# Patient Record
Sex: Male | Born: 1995 | Race: White | Hispanic: No | Marital: Single | Smoking: Never smoker
Health system: Southern US, Community
[De-identification: ages and names within clinical notes are randomized; demographics above are authoritative.]

---

## 2016-04-16 ENCOUNTER — Encounter (HOSPITAL_COMMUNITY): Payer: Self-pay

## 2016-04-16 ENCOUNTER — Emergency Department (HOSPITAL_COMMUNITY): Payer: BLUE CROSS/BLUE SHIELD

## 2016-04-16 ENCOUNTER — Emergency Department (HOSPITAL_COMMUNITY)
Admission: EM | Admit: 2016-04-16 | Discharge: 2016-04-16 | Disposition: A | Discharge status: Home / Self Care | Payer: BLUE CROSS/BLUE SHIELD | Attending: Emergency Medicine | Admitting: Emergency Medicine

## 2016-04-16 DIAGNOSIS — Y9366 Activity, soccer: Secondary | ICD-10-CM | POA: Insufficient documentation

## 2016-04-16 DIAGNOSIS — W1839XA Other fall on same level, initial encounter: Secondary | ICD-10-CM | POA: Insufficient documentation

## 2016-04-16 DIAGNOSIS — Y929 Unspecified place or not applicable: Secondary | ICD-10-CM | POA: Insufficient documentation

## 2016-04-16 DIAGNOSIS — S43015A Anterior dislocation of left humerus, initial encounter: Secondary | ICD-10-CM | POA: Diagnosis not present

## 2016-04-16 DIAGNOSIS — Y999 Unspecified external cause status: Secondary | ICD-10-CM | POA: Insufficient documentation

## 2016-04-16 DIAGNOSIS — S4992XA Unspecified injury of left shoulder and upper arm, initial encounter: Secondary | ICD-10-CM | POA: Diagnosis present

## 2016-04-16 MED ORDER — ETOMIDATE 2 MG/ML IV SOLN
INTRAVENOUS | Status: AC
  Filled 2016-04-16: qty 10

## 2016-04-16 MED ORDER — ETOMIDATE 2 MG/ML IV SOLN
INTRAVENOUS | Status: AC | PRN
  Administered 2016-04-16: 5 mg via INTRAVENOUS

## 2016-04-16 MED ORDER — FENTANYL CITRATE (PF) 100 MCG/2ML IJ SOLN
50.0000 ug | Freq: Once | INTRAMUSCULAR | Status: DC
  Filled 2016-04-16: qty 2

## 2016-04-16 MED ORDER — IBUPROFEN 400 MG PO TABS
600.0000 mg | ORAL_TABLET | Freq: Once | ORAL | Status: AC
  Administered 2016-04-16: 600 mg via ORAL
  Filled 2016-04-16: qty 3

## 2016-04-16 MED ORDER — MIDAZOLAM HCL 2 MG/2ML IJ SOLN
INTRAMUSCULAR | Status: AC
  Filled 2016-04-16: qty 4

## 2016-04-16 MED ORDER — MIDAZOLAM HCL 2 MG/2ML IJ SOLN
INTRAMUSCULAR | Status: AC | PRN
  Administered 2016-04-16: 1 mg via INTRAVENOUS

## 2016-04-16 MED ORDER — FENTANYL CITRATE (PF) 100 MCG/2ML IJ SOLN
INTRAMUSCULAR | Status: AC | PRN
  Administered 2016-04-16: 50 ug via INTRAVENOUS

## 2016-04-16 MED ORDER — ETOMIDATE 2 MG/ML IV SOLN
INTRAVENOUS | Status: AC | PRN
  Administered 2016-04-16: 10 mg via INTRAVENOUS

## 2016-04-16 NOTE — Discharge Instructions (Signed)
Take Ibuprofen 600mg  three times a day for the next 5 days. Use ice as needed

## 2016-04-16 NOTE — ED Provider Notes (Signed)
[facility]-EMERGENCY DEPT Provider Note   CSN: [id number] Arrival date & time: 04/16/16  1944     History   Chief Complaint No chief complaint on file.   HPI Jonathan Villa is a 20 y.o. male who presents with L shoulder pain. No significant PMH. He was playing soccer earlier today and slid and fell on to his left shoulder. He is right hand dominant. He reports deformity, swelling, and numbness of the forearm. Has never dislocated his shoulder before.   HPI  No past medical history on file.  There are no active problems to display for this patient.   No past surgical history on file.     Home Medications    Prior to Admission medications   Not on File    Family History No family history on file.  Social History Social History  Substance Use Topics  . Smoking status: Not on file  . Smokeless tobacco: Not on file  . Alcohol use Not on file     Allergies   Review of patient's allergies indicates not on file.   Review of Systems Review of Systems  Musculoskeletal: Positive for arthralgias. Negative for gait problem and joint swelling.  Neurological: Positive for numbness.  All other systems reviewed and are negative.    Physical Exam Updated Vital Signs BP 113/63   Pulse 78   Resp (!) 32   SpO2 98%   Physical Exam  Constitutional: He is oriented to person, place, and time. He appears well-developed and well-nourished. No distress.  HENT:  Head: Normocephalic and atraumatic.  Eyes: Conjunctivae are normal. Pupils are equal, round, and reactive to light. Right eye exhibits no discharge. Left eye exhibits no discharge. No scleral icterus.  Neck: Normal range of motion. Neck supple.  Cardiovascular: Normal rate and regular rhythm.   No murmur heard. Pulmonary/Chest: Effort normal and breath sounds normal. No respiratory distress.  Abdominal: Soft. He exhibits no distension. There is no tenderness.  Musculoskeletal: He exhibits no edema.  Left shoulder: Obvious  step off deformity. No swelling. Significant tenderness to palpation. ROM deferred. N/V intact.   Neurological: He is alert and oriented to person, place, and time.  Skin: Skin is warm and dry.  Psychiatric: He has a normal mood and affect.  Nursing note and vitals reviewed.    ED Treatments / Results  Labs (all labs ordered are listed, but only abnormal results are displayed) Labs Reviewed - No data to display  EKG  EKG Interpretation None       Radiology Dg Shoulder Left Portable  Result Date: 04/16/2016 CLINICAL DATA:  Post reduction of LEFT shoulder EXAM: LEFT SHOULDER - 1 VIEW COMPARISON:  04/16/2016 FINDINGS: Previously identified LEFT glenohumeral dislocation appears reduced. Osseous mineralization normal. No definite fracture seen. AC joint alignment normal. IMPRESSION: Reduction of previously identified anterior LEFT glenohumeral dislocation. Electronically Signed   By: Ulyses Southward M.D.   On: 04/16/2016 21:00   Dg Shoulder Left Portable  Result Date: 04/16/2016 CLINICAL DATA:  20 year old male with left shoulder dislocation. EXAM: LEFT SHOULDER - 1 VIEW COMPARISON:  None. FINDINGS: There is anterior dislocation of the left shoulder. No definite fracture identified. However, a faint area of apparent cortical discontinue early in the superior aspect of the humeral head on the Y-view projection noted and therefore a Hill-Sachs fracture injury is not entirely excluded. The soft tissues are grossly unremarkable. IMPRESSION: Anterior dislocation of the left shoulder. Electronically Signed   By: Elgie Collard M.D.   On:  04/16/2016 20:36    Procedures Procedures (including critical care time)  Reduction of dislocation Date/Time: 9:00PM Performed by: Bethel BornKelly Marie Muskaan Smet Authorized by: Bethel BornKelly Marie Callee Rohrig Consent: Verbal consent obtained. Risks and benefits: risks, benefits and alternatives were discussed Consent given by: patient Required items: required blood products,  implants, devices, and special equipment available Time out: Immediately prior to procedure a "time out" was called to verify the correct patient, procedure, equipment, support staff and site/side marked as required.  Patient sedated: Etomidate and Versed  Vitals: Vital signs were monitored during sedation. Patient tolerance: Patient tolerated the procedure well with no immediate complications. Joint: L shoulder  Reduction technique: External rotation    Medications Ordered in ED Medications  fentaNYL (SUBLIMAZE) injection 50 mcg ( Intravenous Canceled Entry 04/16/16 2015)  ibuprofen (ADVIL,MOTRIN) tablet 600 mg (not administered)  midazolam (VERSED) injection (1 mg Intravenous Given 04/16/16 2026)  fentaNYL (SUBLIMAZE) injection (50 mcg Intravenous Given 04/16/16 2015)  etomidate (AMIDATE) injection (10 mg Intravenous Given 04/16/16 2026)  etomidate (AMIDATE) injection (5 mg Intravenous Given 04/16/16 2028)  etomidate (AMIDATE) injection (5 mg Intravenous Given 04/16/16 2030)     Initial Impression / Assessment and Plan / ED Course  I have reviewed the triage vital signs and the nursing notes.  Pertinent labs & imaging results that were available during my care of the patient were reviewed by me and considered in my medical decision making (see chart for details).  Clinical Course   [age] year old male presents with a left shoulder dislocation. Fentanyl given. Initial xray shows obvious dislocation. Risks and benefits of procedure discussed and consent form signed. Shoulder reduced under conscious sedation here in the ED. Patient tolerated procedure well. Post-reduction films show successful reduction of shoulder. Shared visit with Dr. Particia NearingHaviland. Patient placed in sling and advised to follow up with Orthopedics. Patient is NAD, non-toxic, with stable VS. Patient is informed of clinical course, understands medical decision making process, and agrees with plan. Opportunity for questions provided and  all questions answered. Return precautions given.   Final Clinical Impressions(s) / ED Diagnoses   Final diagnoses:  Anterior dislocation of left shoulder, initial encounter    New Prescriptions New Prescriptions   No medications on file     Bethel BornKelly Marie Melida Northington, PA-C 04/16/16 2139    Jacalyn LefevreJulie Haviland, MD 04/16/16 2337

## 2016-04-16 NOTE — ED Triage Notes (Signed)
Patient was playing soccer and suffered a left should dislocation.  Patient is A&Ox4.  Trainer at the field tried to put patients shoulder back in but was unsuccessful. Pulses present but sensation has decreased per patient report.

## 2016-04-16 NOTE — Progress Notes (Signed)
RT NOTE:  RT @ bedside for conscious sedation. 0.5L ETCO2 cannula place on patient. AMBU bag available @ bedside. Pt stable throughout EtCO2 35-39, RR: 18-28, SpO2 100%.

## 2016-04-18 NOTE — ED Notes (Signed)
Patient Alert and oriented X4. Stable and ambulatory. Patient verbalized understanding of the discharge instructions.  Patient belongings were taken by the patient.  

## 2017-11-13 IMAGING — CR DG SHOULDER 1V*L*
2 series · 2 of 2 positions shown · non-contrast
Comparison: 04/16/2016

CLINICAL DATA: Post reduction of LEFT shoulder

EXAM:
LEFT SHOULDER - 1 VIEW

[AP (1 of 2)]
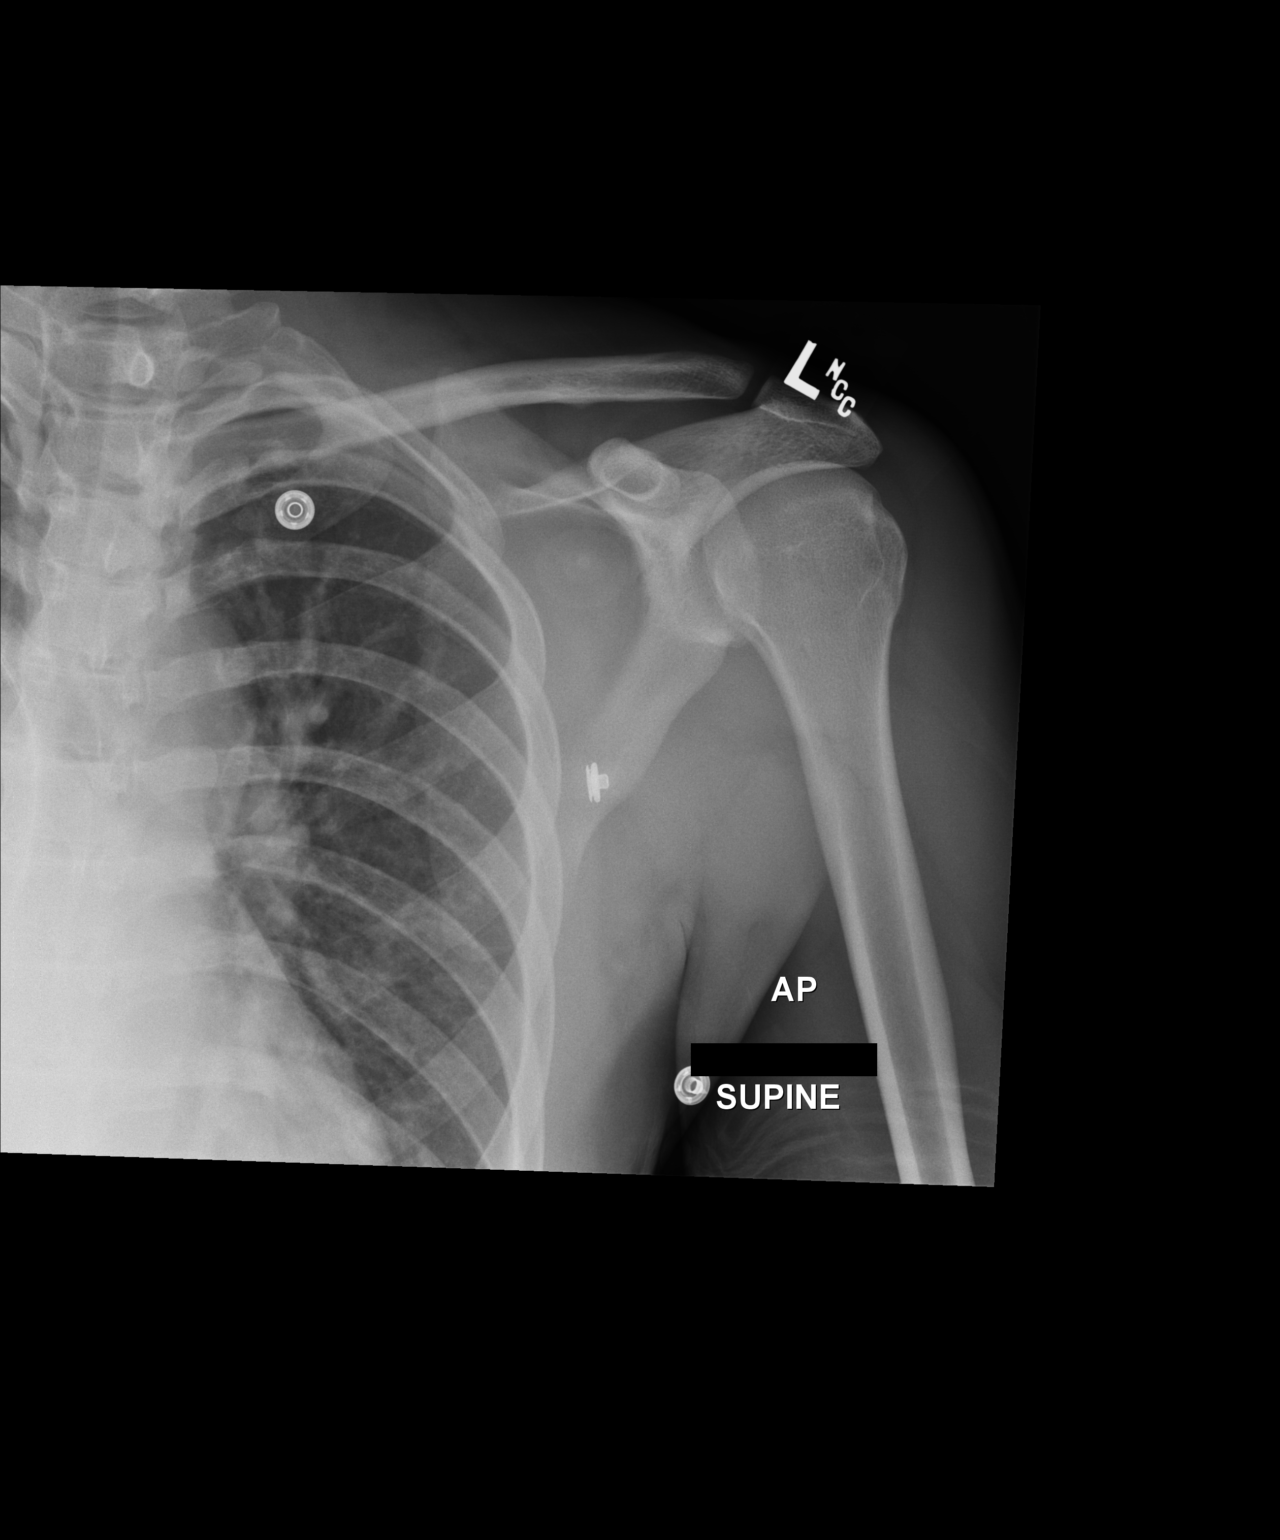

[AP (2 of 2)]
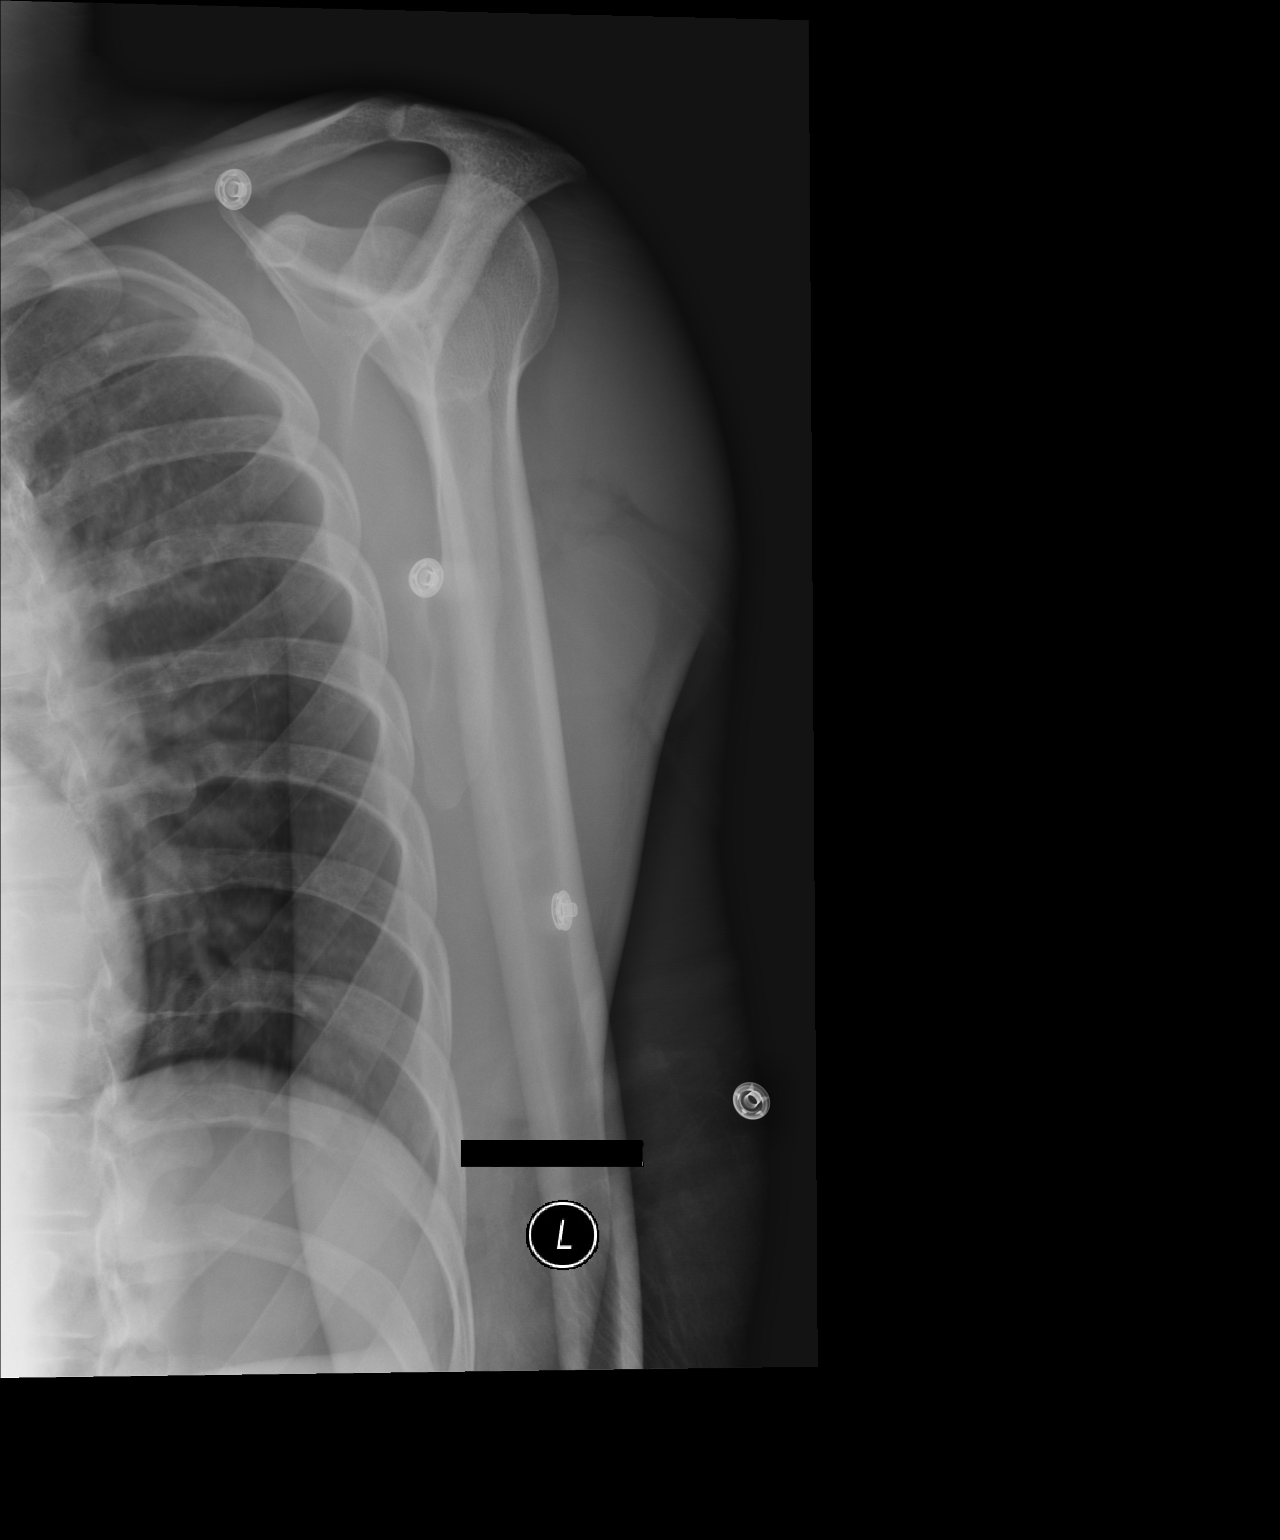

[2 of 2 positions shown; findings below may reference images not displayed]

FINDINGS: Previously identified LEFT glenohumeral dislocation appears reduced.

Osseous mineralization normal.

No definite fracture seen.

AC joint alignment normal.
IMPRESSION: Reduction of previously identified anterior LEFT glenohumeral
dislocation.

## 2017-11-13 IMAGING — CR DG SHOULDER 1V*L*
1 series · 1 of 1 positions shown · non-contrast
Comparison: None.

CLINICAL DATA: 20-year-old male with left shoulder dislocation.

EXAM:
LEFT SHOULDER - 1 VIEW

[AP]
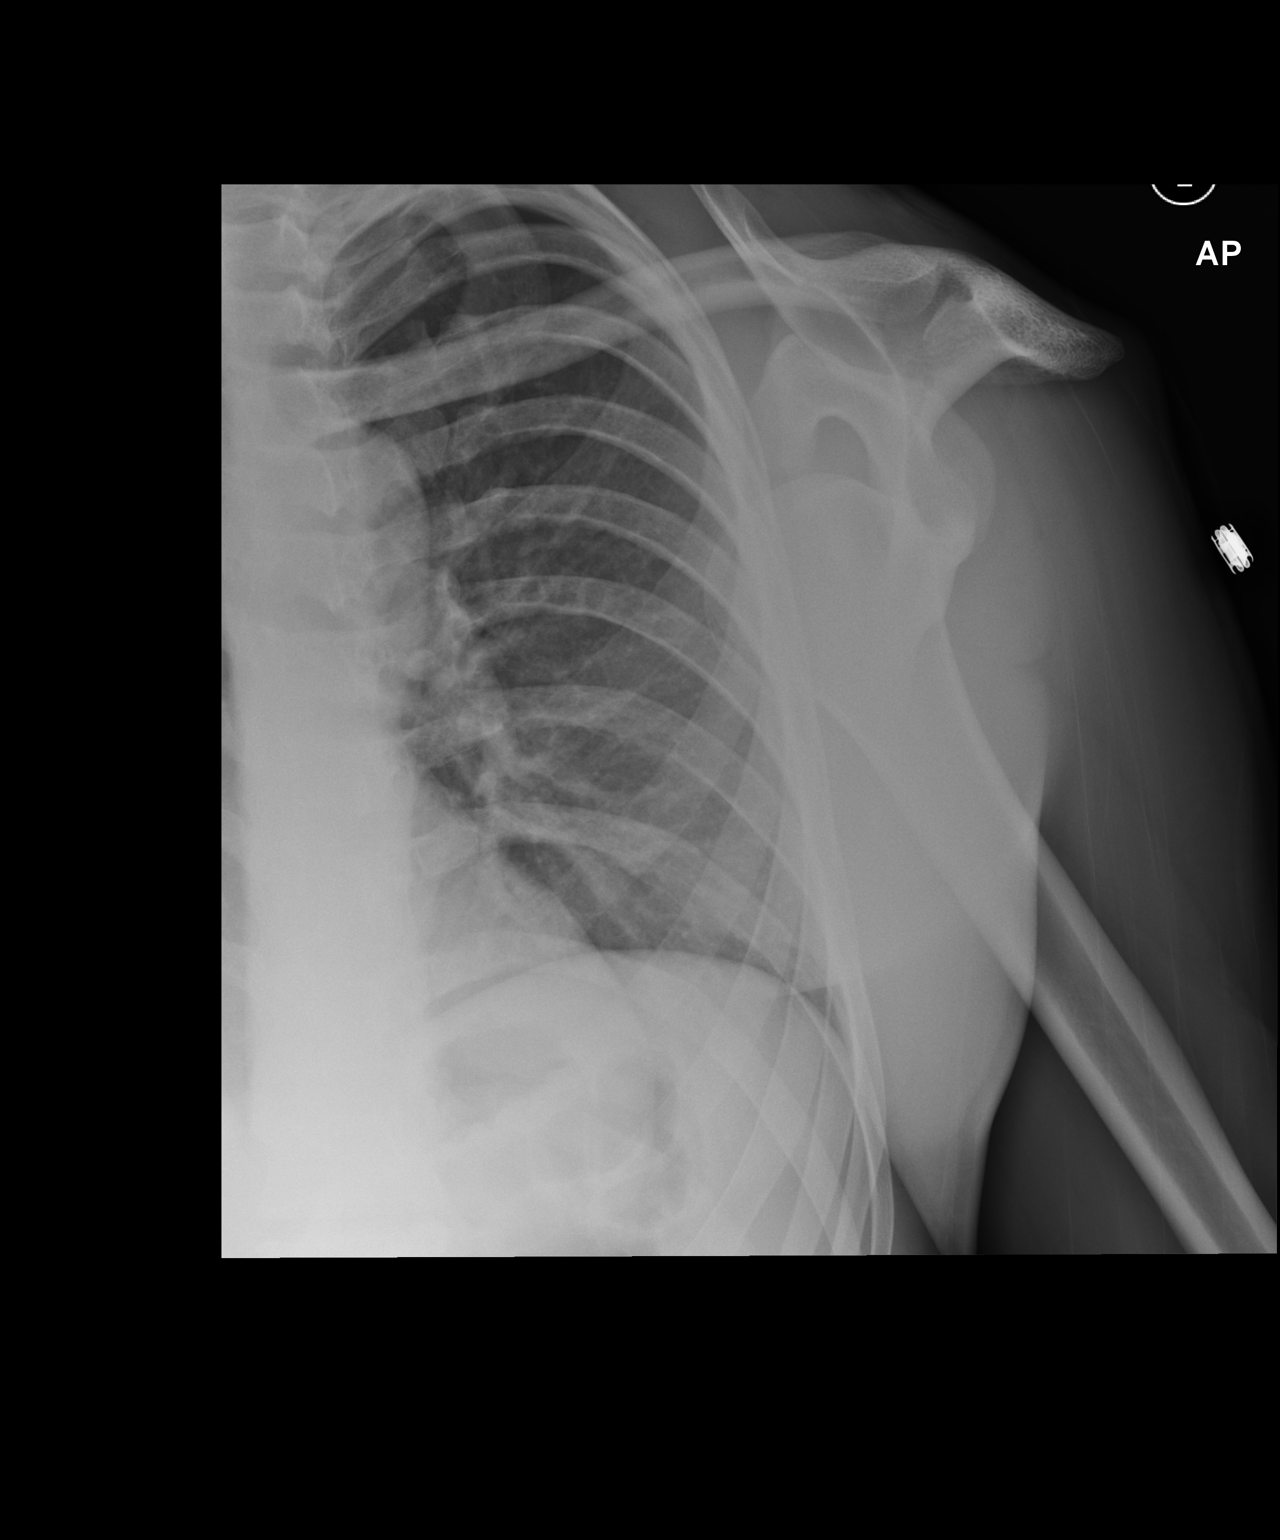

[1 of 1 positions shown; findings below may reference images not displayed]

FINDINGS: There is anterior dislocation of the left shoulder. No definite
fracture identified. However, a faint area of apparent cortical
discontinue early in the superior aspect of the humeral head on the
Y-view projection noted and therefore a Hill-Sachs fracture injury
is not entirely excluded. The soft tissues are grossly unremarkable.
IMPRESSION: Anterior dislocation of the left shoulder.
